# Patient Record
Sex: Male | Born: 1955 | Race: White | Hispanic: No | Marital: Married | Smoking: Never smoker
Health system: Southern US, Community
[De-identification: ages and names within clinical notes are randomized; demographics above are authoritative.]

## PROBLEM LIST (undated history)

## (undated) DIAGNOSIS — I10 Essential (primary) hypertension: Secondary | ICD-10-CM

## (undated) DIAGNOSIS — E785 Hyperlipidemia, unspecified: Secondary | ICD-10-CM

## (undated) HISTORY — DX: Hyperlipidemia, unspecified: E78.5

## (undated) HISTORY — DX: Essential (primary) hypertension: I10

---

## 2018-10-24 ENCOUNTER — Other Ambulatory Visit: Payer: Self-pay

## 2018-10-24 DIAGNOSIS — Z20822 Contact with and (suspected) exposure to covid-19: Secondary | ICD-10-CM

## 2018-10-26 LAB — NOVEL CORONAVIRUS, NAA: SARS-CoV-2, NAA: NOT DETECTED

## 2018-11-16 DIAGNOSIS — I10 Essential (primary) hypertension: Secondary | ICD-10-CM | POA: Diagnosis not present

## 2018-11-16 DIAGNOSIS — E782 Mixed hyperlipidemia: Secondary | ICD-10-CM | POA: Diagnosis not present

## 2018-11-16 DIAGNOSIS — Z125 Encounter for screening for malignant neoplasm of prostate: Secondary | ICD-10-CM | POA: Diagnosis not present

## 2019-05-19 DIAGNOSIS — E782 Mixed hyperlipidemia: Secondary | ICD-10-CM | POA: Diagnosis not present

## 2019-05-19 DIAGNOSIS — I10 Essential (primary) hypertension: Secondary | ICD-10-CM | POA: Diagnosis not present

## 2020-02-20 DIAGNOSIS — Z0000 Encounter for general adult medical examination without abnormal findings: Secondary | ICD-10-CM | POA: Diagnosis not present

## 2020-02-20 DIAGNOSIS — I10 Essential (primary) hypertension: Secondary | ICD-10-CM | POA: Diagnosis not present

## 2020-02-20 DIAGNOSIS — Z125 Encounter for screening for malignant neoplasm of prostate: Secondary | ICD-10-CM | POA: Diagnosis not present

## 2020-03-11 ENCOUNTER — Other Ambulatory Visit: Payer: Self-pay

## 2020-03-11 DIAGNOSIS — Z20822 Contact with and (suspected) exposure to covid-19: Secondary | ICD-10-CM

## 2020-03-14 LAB — NOVEL CORONAVIRUS, NAA: SARS-CoV-2, NAA: NOT DETECTED

## 2020-03-27 DIAGNOSIS — L989 Disorder of the skin and subcutaneous tissue, unspecified: Secondary | ICD-10-CM | POA: Diagnosis not present

## 2020-03-28 DIAGNOSIS — L720 Epidermal cyst: Secondary | ICD-10-CM | POA: Diagnosis not present

## 2020-04-03 DIAGNOSIS — Z4802 Encounter for removal of sutures: Secondary | ICD-10-CM | POA: Diagnosis not present

## 2020-04-03 DIAGNOSIS — I10 Essential (primary) hypertension: Secondary | ICD-10-CM | POA: Diagnosis not present

## 2020-07-17 DIAGNOSIS — R0602 Shortness of breath: Secondary | ICD-10-CM | POA: Insufficient documentation

## 2020-07-17 NOTE — Progress Notes (Signed)
Cardiology Office Note   Date:  07/20/2020   ID:  Ashar Lewinski, DOB 1955/04/19, MRN [id number]  PCP:  Shon Hale, MD  Cardiologist:   None Referring:  Shon Hale, MD  Chief Complaint  Patient presents with  . Shortness of Breath      History of Present Illness: Barry Cooper is a 65 y.o. male who is referred by Shon Hale, MD for evaluation of SOB.  He has no past cardiac history.  He denies any chest pressure, neck or arm discomfort.  However, he has noticed when he does his bike riding which she does 25 to 30 miles at a time that he might get short of breath going up hills and was not really noticing that last year.  He is not having any shortness of breath when he rides on the level.  He is not having any resting shortness of breath, PND or orthopnea.  He has had no palpitations, presyncope or syncope.  He said his only other cardiac testing was years ago on a treadmill.  He has gained about 10 pounds over a year.  He does have allergies which could be causing his symptoms.  Past Medical History:  Diagnosis Date  . Dyslipidemia   . Hypertension     History reviewed. No pertinent surgical history.   Current Outpatient Medications  Medication Sig Dispense Refill  . hydrochlorothiazide (HYDRODIURIL) 25 MG tablet Take 1 tablet by mouth every morning.    Marland Kitchen losartan (COZAAR) 100 MG tablet Take 1 tablet by mouth daily.    . rosuvastatin (CRESTOR) 20 MG tablet Take 20 mg by mouth daily.     No current facility-administered medications for this visit.    Allergies:   Patient has no allergy information on record.    Social History:  The patient  reports that he has never smoked. He has never used smokeless tobacco. He reports current alcohol use of about 1.0 standard drink of alcohol per week. He reports that he does not use drugs.   Family History:  The patient's family history includes Heart disease in his father.    ROS:  Please see  the history of present illness.   Otherwise, review of systems are positive for none.   All other systems are reviewed and negative.    PHYSICAL EXAM: VS:  BP 132/68   Pulse 70   Ht 5\' 7"  (1.702 m)   Wt 190 lb (86.2 kg)   SpO2 99%   BMI 29.76 kg/m  , BMI Body mass index is 29.76 kg/m. GENERAL:  Well appearing HEENT:  Pupils equal round and reactive, fundi not visualized, oral mucosa unremarkable NECK:  No jugular venous distention, waveform within normal limits, carotid upstroke brisk and symmetric, no bruits, no thyromegaly LYMPHATICS:  No cervical, inguinal adenopathy LUNGS:  Clear to auscultation bilaterally BACK:  No CVA tenderness CHEST:  Unremarkable HEART:  PMI not displaced or sustained,S1 and S2 within normal limits, no S3, positive S4, no clicks, no rubs, no murmurs ABD:  Flat, positive bowel sounds normal in frequency in pitch, no bruits, no rebound, no guarding, no midline pulsatile mass, no hepatomegaly, no splenomegaly EXT:  2 plus pulses throughout, no edema, no cyanosis no clubbing SKIN:  No rashes no nodules NEURO:  Cranial nerves II through XII grossly intact, motor grossly intact throughout PSYCH:  Cognitively intact, oriented to person place and time    EKG:  EKG is ordered today. The ekg ordered  today demonstrates sinus rhythm, rate 78, axis within normal limits, intervals within normal limits, lateral T wave inversions without old EKGs for comparison.   Recent Labs: No results found for requested labs within last 8760 hours.    Lipid Panel No results found for: CHOL, TRIG, HDL, CHOLHDL, VLDL, LDLCALC, LDLDIRECT    Wt Readings from Last 3 Encounters:  07/18/20 190 lb (86.2 kg)      Other studies Reviewed: Additional studies/ records that were reviewed today include: Office records. Review of the above records demonstrates:  Please see elsewhere in the note.     ASSESSMENT AND PLAN:  DOE:    He has significant risk factors.  I am going to screen  him first with a calcium score and then decide further testing.  He might be a good candidate for an exercise echocardiogram given the EKG abnormalities and a question of an S4 with some hypertensive effect.  ABNORMAL EKG: This will be screened as above  HTN: The goal will be 120s over 70s.  He will continue the meds as listed.   DYSLIPIDEMIA: Total cholesterol was 163.  Last year his LDL was 115 and HDL 50.  Goals of therapy will be based on his calcium score.   Current medicines are reviewed at length with the patient today.  The patient does not have concerns regarding medicines.  The following changes have been made:  no change  Labs/ tests ordered today include:   Orders Placed This Encounter  Procedures  . CT CARDIAC SCORING (SELF PAY ONLY)  . Lipid panel  . EKG 12-Lead     Disposition:   FU with me based on the results of the above   Signed, Rollene Rotunda, MD  07/20/2020 7:32 AM    [facility]

## 2020-07-18 ENCOUNTER — Ambulatory Visit (INDEPENDENT_AMBULATORY_CARE_PROVIDER_SITE_OTHER): Payer: BC Managed Care – PPO | Admitting: Cardiology

## 2020-07-18 ENCOUNTER — Other Ambulatory Visit: Payer: Self-pay

## 2020-07-18 ENCOUNTER — Encounter: Payer: Self-pay | Admitting: Cardiology

## 2020-07-18 VITALS — BP 132/68 | HR 70 | Ht 67.0 in | Wt 190.0 lb

## 2020-07-18 DIAGNOSIS — R0602 Shortness of breath: Secondary | ICD-10-CM

## 2020-07-18 NOTE — Patient Instructions (Signed)
  Testing/Procedures:  .CORONARY CT CALCIUM SCORING AT [address]   Follow-Up: At [facility], you and your health needs are our priority.  As part of our continuing mission to provide you with exceptional heart care, we have created designated Provider Care Teams.  These Care Teams include your primary Cardiologist (physician) and Advanced Practice Providers (APPs -  Physician Assistants and Nurse Practitioners) who all work together to provide you with the care you need, when you need it.  We recommend signing up for the patient portal called "MyChart".  Sign up information is provided on this After Visit Summary.  MyChart is used to connect with patients for Virtual Visits (Telemedicine).  Patients are able to view lab/test results, encounter notes, upcoming appointments, etc.  Non-urgent messages can be sent to your provider as well.   To learn more about what you can do with MyChart, go to [email or web address].    Your next appointment:    AS NEEDED 

## 2020-07-20 ENCOUNTER — Encounter: Payer: Self-pay | Admitting: Cardiology

## 2020-07-23 ENCOUNTER — Ambulatory Visit (INDEPENDENT_AMBULATORY_CARE_PROVIDER_SITE_OTHER)
Admission: RE | Admit: 2020-07-23 | Discharge: 2020-07-23 | Disposition: A | Discharge status: Home / Self Care | Payer: Self-pay | Source: Ambulatory Visit | Attending: Cardiology | Admitting: Cardiology

## 2020-07-23 ENCOUNTER — Other Ambulatory Visit: Payer: Self-pay

## 2020-07-23 DIAGNOSIS — R0602 Shortness of breath: Secondary | ICD-10-CM

## 2020-07-24 ENCOUNTER — Telehealth: Payer: Self-pay | Admitting: *Deleted

## 2020-07-24 DIAGNOSIS — R0609 Other forms of dyspnea: Secondary | ICD-10-CM

## 2020-07-24 DIAGNOSIS — R0600 Dyspnea, unspecified: Secondary | ICD-10-CM

## 2020-07-24 NOTE — Telephone Encounter (Signed)
-----   Message from Rollene Rotunda, MD sent at 07/24/2020  1:21 PM EDT ----- Negative coronary calcium.  I would like for him to have a stress echo for DOE and abnormal EKG.  Call Mr. Sahr with the results and send results to Shon Hale, MD

## 2020-07-24 NOTE — Telephone Encounter (Signed)
Advised patient and message sent to scheduling to arrange.  

## 2020-08-21 DIAGNOSIS — I10 Essential (primary) hypertension: Secondary | ICD-10-CM | POA: Diagnosis not present

## 2020-08-21 DIAGNOSIS — R0602 Shortness of breath: Secondary | ICD-10-CM | POA: Diagnosis not present

## 2020-08-21 DIAGNOSIS — E782 Mixed hyperlipidemia: Secondary | ICD-10-CM | POA: Diagnosis not present

## 2020-08-23 DIAGNOSIS — I10 Essential (primary) hypertension: Secondary | ICD-10-CM | POA: Diagnosis not present

## 2020-08-28 ENCOUNTER — Other Ambulatory Visit (HOSPITAL_COMMUNITY): Payer: BC Managed Care – PPO

## 2020-08-30 ENCOUNTER — Telehealth (HOSPITAL_COMMUNITY): Payer: Self-pay | Admitting: Cardiology

## 2020-08-30 NOTE — Telephone Encounter (Signed)
Patient called and cancelled Stress echocardiogram for th reason below:  08/15/2020 4:17 PM [id number], LESLIE O  Cancel Rsn: Patient (pt is meeting with his PCP on 08/24/20 and he will give our office a callbak to r/s)   Order will be removed from the ECHO WQ. If patient calls back to reschedule we will reinstate the order. Thank you

## 2021-03-04 DIAGNOSIS — Z125 Encounter for screening for malignant neoplasm of prostate: Secondary | ICD-10-CM | POA: Diagnosis not present

## 2021-03-04 DIAGNOSIS — Z0000 Encounter for general adult medical examination without abnormal findings: Secondary | ICD-10-CM | POA: Diagnosis not present

## 2021-03-04 DIAGNOSIS — I10 Essential (primary) hypertension: Secondary | ICD-10-CM | POA: Diagnosis not present

## 2021-03-04 DIAGNOSIS — E782 Mixed hyperlipidemia: Secondary | ICD-10-CM | POA: Diagnosis not present

## 2021-06-06 DIAGNOSIS — D649 Anemia, unspecified: Secondary | ICD-10-CM | POA: Diagnosis not present

## 2021-06-23 DIAGNOSIS — R310 Gross hematuria: Secondary | ICD-10-CM | POA: Diagnosis not present

## 2021-06-23 DIAGNOSIS — M791 Myalgia, unspecified site: Secondary | ICD-10-CM | POA: Diagnosis not present

## 2021-06-26 DIAGNOSIS — R319 Hematuria, unspecified: Secondary | ICD-10-CM | POA: Diagnosis not present

## 2021-06-26 DIAGNOSIS — N400 Enlarged prostate without lower urinary tract symptoms: Secondary | ICD-10-CM | POA: Diagnosis not present

## 2021-12-25 DIAGNOSIS — D649 Anemia, unspecified: Secondary | ICD-10-CM | POA: Diagnosis not present

## 2022-03-06 DIAGNOSIS — Z0000 Encounter for general adult medical examination without abnormal findings: Secondary | ICD-10-CM | POA: Diagnosis not present

## 2022-03-06 DIAGNOSIS — I10 Essential (primary) hypertension: Secondary | ICD-10-CM | POA: Diagnosis not present

## 2022-03-06 DIAGNOSIS — Z125 Encounter for screening for malignant neoplasm of prostate: Secondary | ICD-10-CM | POA: Diagnosis not present

## 2022-03-06 DIAGNOSIS — D508 Other iron deficiency anemias: Secondary | ICD-10-CM | POA: Diagnosis not present

## 2022-03-06 DIAGNOSIS — E782 Mixed hyperlipidemia: Secondary | ICD-10-CM | POA: Diagnosis not present

## 2022-06-12 IMAGING — CT CT CARDIAC CORONARY ARTERY CALCIUM SCORE
3 series · 14 of 20 positions shown, 15 images · non-contrast
Comparison: No priors.
COMPARISON: No priors.

Addendum:
EXAM:
OVER-READ INTERPRETATION  CT CHEST

The following report is an over-read performed by radiologist Dr.
Marshalene Adivhaho [REDACTED] on 07/23/2020. This
over-read does not include interpretation of cardiac or coronary
anatomy or pathology. The coronary calcium score interpretation by
the cardiologist is attached.
CLINICAL DATA: Cardiovascular Disease Risk stratification
Coronary Calcium Score
TECHNIQUE: A gated, non-contrast computed tomography scan of the heart was
performed using 3mm slice thickness. Axial images were analyzed on a
dedicated workstation. Calcium scoring of the coronary arteries was
performed using the Agatston method.

[Series 2: casc 3.0 bv41 2 bestdiast 72 % · axial · 0.46mm/px · z∈[-204,-140]mm · 4 of 37 slices shown, 5 images]
[im 8/37  vessel]
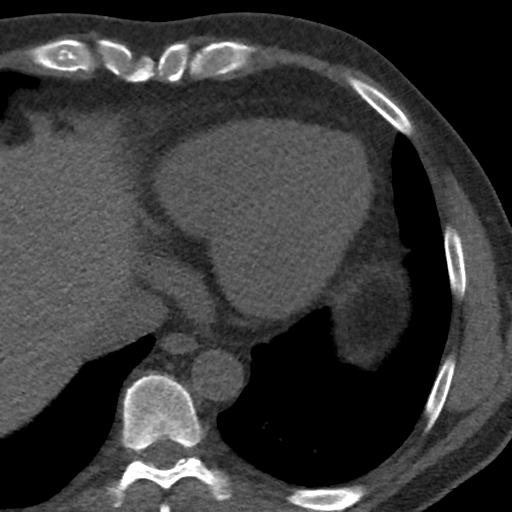
[im 8/37  lung]
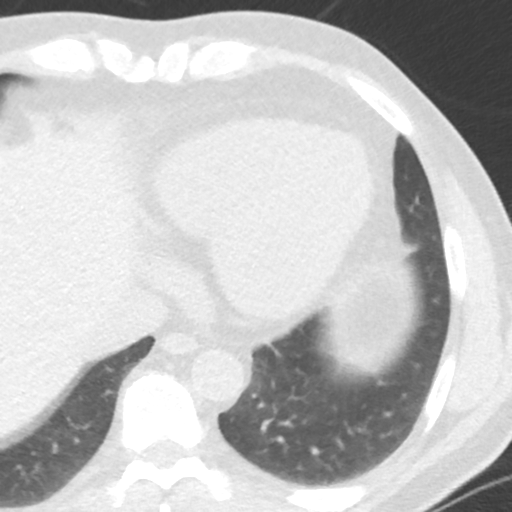
[im 15/37  vessel]
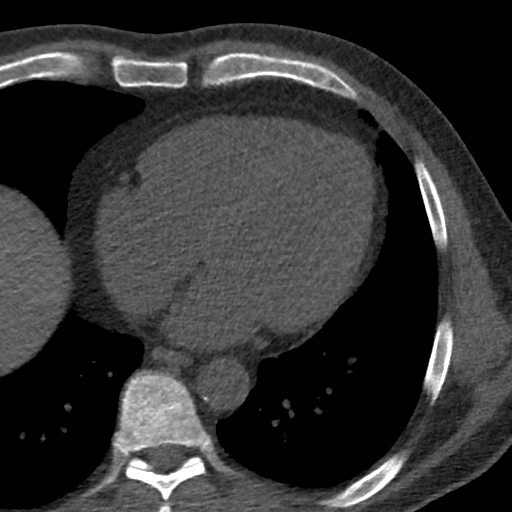
[im 22/37  vessel]
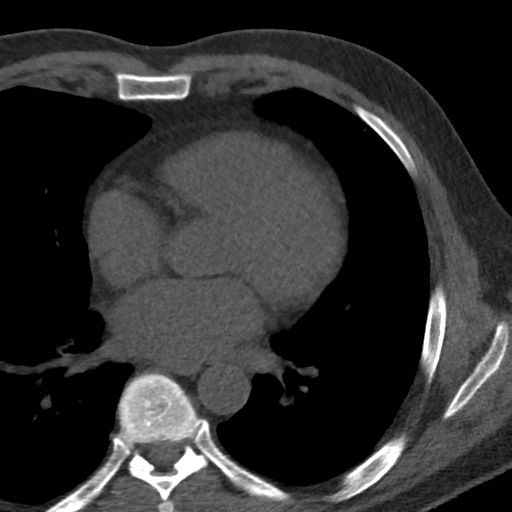
[im 29/37  vessel]
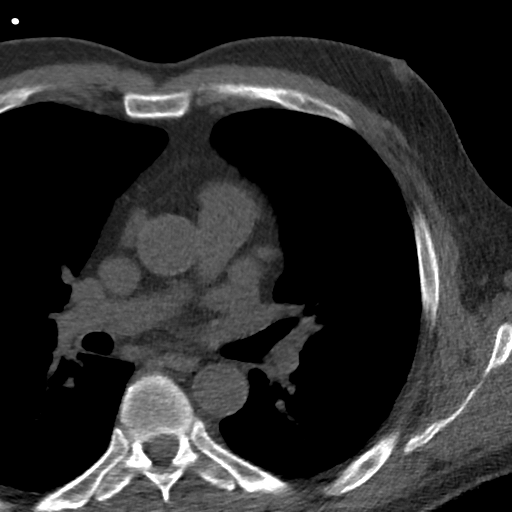

[Series 3: lung 72 % · axial · 0.72mm/px · z∈[-206,-134]mm · 5 of 37 slices shown]
[im 7/37  lung]
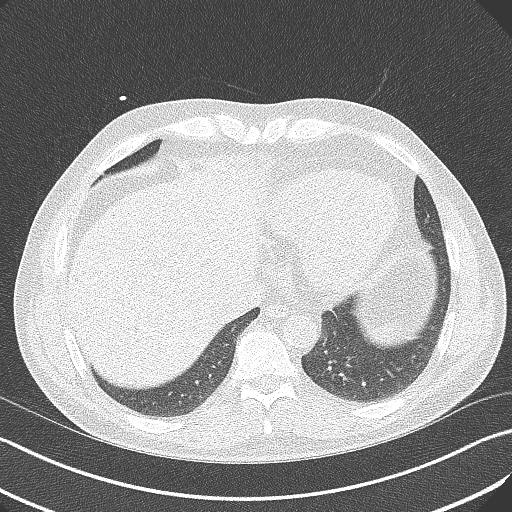
[im 13/37  lung]
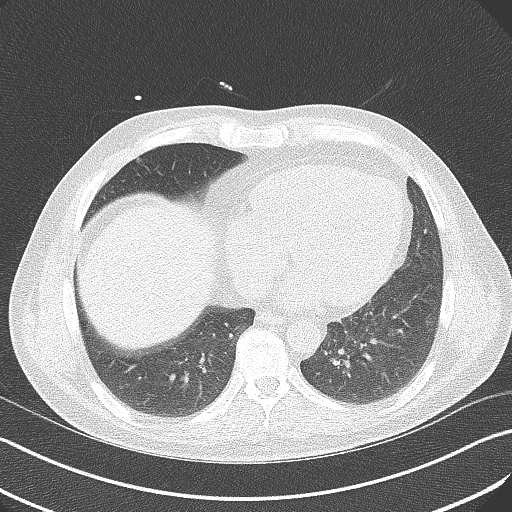
[im 19/37  lung]
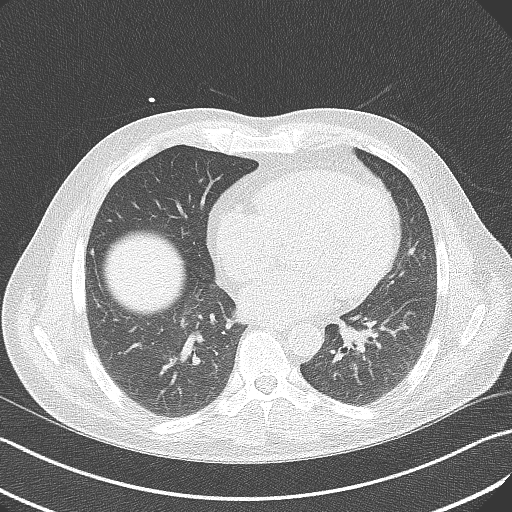
[im 25/37  lung]
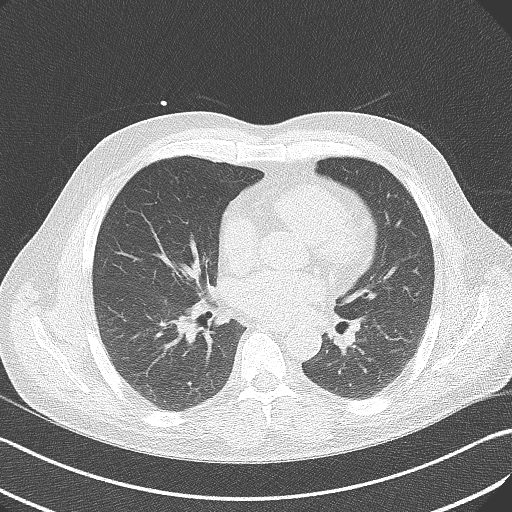
[im 31/37  lung]
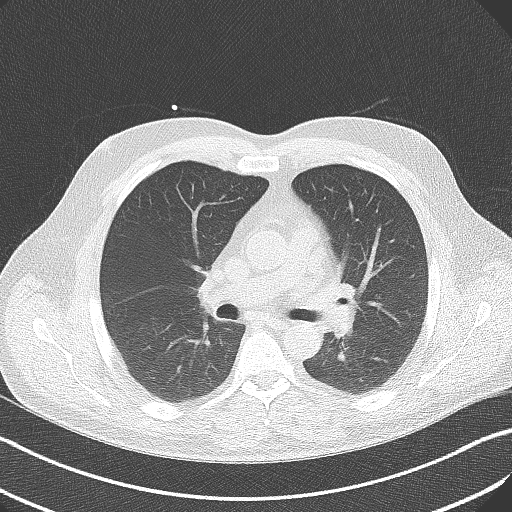

[Series 4: lung st 72 % · axial · 0.72mm/px · z∈[-206,-134]mm · 5 of 37 slices shown]
[im 7/37  lung]
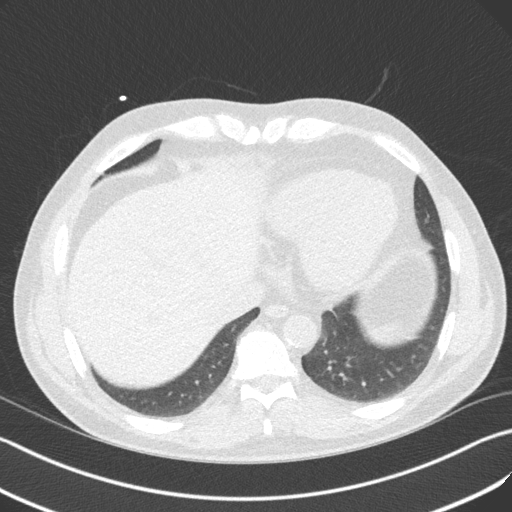
[im 13/37  lung]
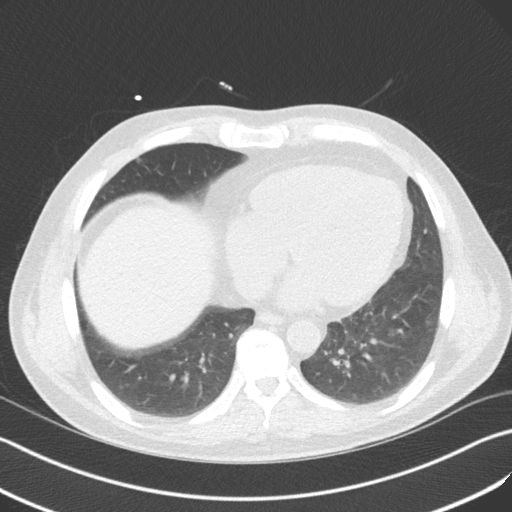
[im 19/37  lung]
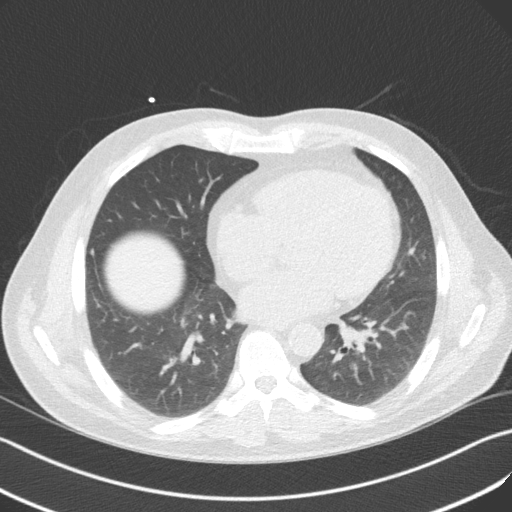
[im 25/37  lung]
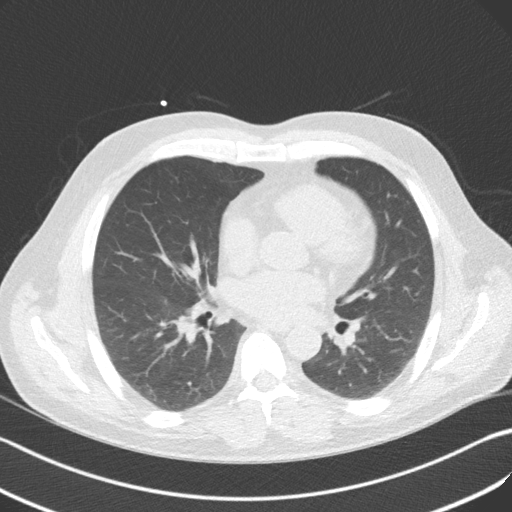
[im 31/37  lung]
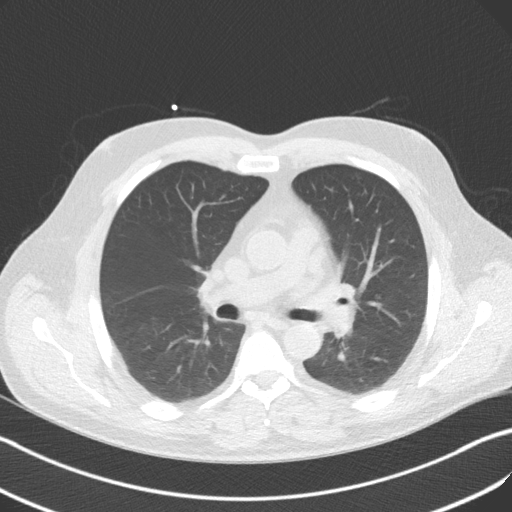

[14 of 20 positions shown; findings below may reference images not displayed]

FINDINGS: Aortic atherosclerosis. Small calcified granuloma in the right
middle lobe. 3 mm noncalcified pulmonary nodule in the inferior
aspect of the lingula (axial image 17 of series 3). Within the
visualized portions of the thorax there are no other larger more
suspicious appearing pulmonary nodules or masses, there is no acute
consolidative airspace disease, no pleural effusions, no
pneumothorax and no lymphadenopathy. Visualized portions of the
upper abdomen are unremarkable. There are no aggressive appearing
lytic or blastic lesions noted in the visualized portions of the
skeleton.
IMPRESSION: 1. 3 mm nodule in the lingula. This is nonspecific, but
statistically likely benign. No follow-up needed if patient is
low-risk. Non-contrast chest CT can be considered in 12 months if
patient is high-risk. This recommendation follows the consensus
statement: Guidelines for Management of Incidental Pulmonary Nodules
Detected on CT Images: From the [HOSPITAL] 5983; Radiology
2.  Aortic Atherosclerosis (ATPH8-CXX.X).
FINDINGS: Coronary arteries: Normal origins.

Coronary Calcium Score:

Left main: 0

Left anterior descending artery: 0

Left circumflex artery: 0

Right coronary artery: 0

Total: 0

Percentile: 0

Pericardium: Normal.

Ascending Aorta: Normal caliber. Scattered calcifications in the
descending aorta.

Non-cardiac: See separate report from [REDACTED].
IMPRESSION: Coronary calcium score of 0. This was 0 percentile for age-, race-,
and sex-matched controls.



If CAC=0, it is reasonable to withhold statin therapy and reassess
in 5 to 10 years, as long as higher risk conditions are absent
(diabetes mellitus, family history of premature CHD in first degree
relatives (males <55 years; females <65 years), cigarette smoking,
or LDL >=190 mg/dL).

If CAC is 1 to 99, it is reasonable to initiate statin therapy for
patients >=55 years of age.

If CAC is >=100 or >=75th percentile, it is reasonable to initiate
statin therapy at any age.

Cardiology referral should be considered for patients with CAC
scores >=400 or >=75th percentile.

*3136 AHA/ACC/AACVPR/AAPA/ABC/MANGUM/FITZGERALD/TEMPRANO/Juan Pedro/AFIEN/JIM/JEFFERS
Guideline on the Management of Blood Cholesterol: A Report of the
American College of Cardiology/American Heart Association Task Force
on Clinical Practice Guidelines. J Am Coll Cardiol.
5138;73(24):9009-9559.

*** End of Addendum ***
EXAM:
OVER-READ INTERPRETATION  CT CHEST

The following report is an over-read performed by radiologist Dr.
Marshalene Adivhaho [REDACTED] on 07/23/2020. This
over-read does not include interpretation of cardiac or coronary
anatomy or pathology. The coronary calcium score interpretation by
the cardiologist is attached.
FINDINGS: Aortic atherosclerosis. Small calcified granuloma in the right
middle lobe. 3 mm noncalcified pulmonary nodule in the inferior
aspect of the lingula (axial image 17 of series 3). Within the
visualized portions of the thorax there are no other larger more
suspicious appearing pulmonary nodules or masses, there is no acute
consolidative airspace disease, no pleural effusions, no
pneumothorax and no lymphadenopathy. Visualized portions of the
upper abdomen are unremarkable. There are no aggressive appearing
lytic or blastic lesions noted in the visualized portions of the
skeleton.
IMPRESSION: 1. 3 mm nodule in the lingula. This is nonspecific, but
statistically likely benign. No follow-up needed if patient is
low-risk. Non-contrast chest CT can be considered in 12 months if
patient is high-risk. This recommendation follows the consensus
statement: Guidelines for Management of Incidental Pulmonary Nodules
Detected on CT Images: From the [HOSPITAL] 5983; Radiology
2.  Aortic Atherosclerosis (ATPH8-CXX.X).

## 2023-01-27 DIAGNOSIS — L570 Actinic keratosis: Secondary | ICD-10-CM | POA: Diagnosis not present

## 2023-01-27 DIAGNOSIS — L821 Other seborrheic keratosis: Secondary | ICD-10-CM | POA: Diagnosis not present

## 2023-03-30 DIAGNOSIS — E782 Mixed hyperlipidemia: Secondary | ICD-10-CM | POA: Diagnosis not present

## 2023-03-30 DIAGNOSIS — Z0000 Encounter for general adult medical examination without abnormal findings: Secondary | ICD-10-CM | POA: Diagnosis not present

## 2023-03-30 DIAGNOSIS — D508 Other iron deficiency anemias: Secondary | ICD-10-CM | POA: Diagnosis not present

## 2023-03-30 DIAGNOSIS — Z125 Encounter for screening for malignant neoplasm of prostate: Secondary | ICD-10-CM | POA: Diagnosis not present

## 2023-03-30 DIAGNOSIS — Z23 Encounter for immunization: Secondary | ICD-10-CM | POA: Diagnosis not present

## 2023-03-30 DIAGNOSIS — M79601 Pain in right arm: Secondary | ICD-10-CM | POA: Diagnosis not present

## 2023-03-30 DIAGNOSIS — I10 Essential (primary) hypertension: Secondary | ICD-10-CM | POA: Diagnosis not present

## 2023-04-01 DIAGNOSIS — Z1211 Encounter for screening for malignant neoplasm of colon: Secondary | ICD-10-CM | POA: Diagnosis not present
